# Patient Record
Sex: Male | Born: 1950 | Hispanic: No | Marital: Married | Smoking: Never smoker
Health system: Southern US, Community
[De-identification: ages and names within clinical notes are randomized; demographics above are authoritative.]

## PROBLEM LIST (undated history)

## (undated) DIAGNOSIS — C61 Malignant neoplasm of prostate: Secondary | ICD-10-CM

---

## 2002-05-07 ENCOUNTER — Encounter: Payer: Self-pay | Admitting: General Surgery

## 2002-05-13 ENCOUNTER — Ambulatory Visit (HOSPITAL_COMMUNITY): Admission: RE | Admit: 2002-05-13 | Discharge: 2002-05-13 | Payer: Self-pay | Admitting: General Surgery

## 2002-05-28 ENCOUNTER — Encounter: Payer: Self-pay | Admitting: General Surgery

## 2002-05-28 ENCOUNTER — Ambulatory Visit (HOSPITAL_COMMUNITY): Admission: RE | Admit: 2002-05-28 | Discharge: 2002-05-28 | Payer: Self-pay | Admitting: General Surgery

## 2002-09-18 ENCOUNTER — Encounter: Admission: RE | Admit: 2002-09-18 | Discharge: 2002-09-18 | Payer: Self-pay | Admitting: Thoracic Surgery

## 2002-09-18 ENCOUNTER — Encounter: Payer: Self-pay | Admitting: Thoracic Surgery

## 2003-03-25 ENCOUNTER — Encounter: Admission: RE | Admit: 2003-03-25 | Discharge: 2003-03-25 | Payer: Self-pay | Admitting: Thoracic Surgery

## 2003-07-23 ENCOUNTER — Ambulatory Visit (HOSPITAL_COMMUNITY): Admission: RE | Admit: 2003-07-23 | Discharge: 2003-07-23 | Payer: Self-pay | Admitting: Gastroenterology

## 2004-07-21 ENCOUNTER — Encounter (INDEPENDENT_AMBULATORY_CARE_PROVIDER_SITE_OTHER): Payer: Self-pay | Admitting: Specialist

## 2004-07-21 ENCOUNTER — Ambulatory Visit (HOSPITAL_BASED_OUTPATIENT_CLINIC_OR_DEPARTMENT_OTHER): Admission: RE | Admit: 2004-07-21 | Discharge: 2004-07-21 | Payer: Self-pay | Admitting: General Surgery

## 2004-07-21 ENCOUNTER — Ambulatory Visit (HOSPITAL_COMMUNITY): Admission: RE | Admit: 2004-07-21 | Discharge: 2004-07-21 | Payer: Self-pay | Admitting: General Surgery

## 2007-06-09 ENCOUNTER — Ambulatory Visit: Payer: Self-pay | Admitting: Nephrology

## 2007-06-09 ENCOUNTER — Inpatient Hospital Stay (HOSPITAL_COMMUNITY): Admission: EM | Admit: 2007-06-09 | Discharge: 2007-06-14 | Payer: Self-pay | Admitting: Emergency Medicine

## 2007-06-11 ENCOUNTER — Encounter (INDEPENDENT_AMBULATORY_CARE_PROVIDER_SITE_OTHER): Payer: Self-pay | Admitting: Internal Medicine

## 2007-06-11 ENCOUNTER — Ambulatory Visit: Payer: Self-pay | Admitting: Vascular Surgery

## 2007-12-29 ENCOUNTER — Encounter (INDEPENDENT_AMBULATORY_CARE_PROVIDER_SITE_OTHER): Payer: Self-pay | Admitting: Urology

## 2007-12-29 ENCOUNTER — Inpatient Hospital Stay (HOSPITAL_COMMUNITY): Admission: RE | Admit: 2007-12-29 | Discharge: 2007-12-30 | Payer: Self-pay | Admitting: Urology

## 2010-06-13 NOTE — Consult Note (Signed)
NAMEIBROHIM, SIMMERS            ACCOUNT NO.:  [id number]   MEDICAL RECORD NO.:  [id number]          PATIENT TYPE:  INP   LOCATION:  3311                         FACILITY:  [facility]   PHYSICIAN:  Stephani Police, PA    DATE OF BIRTH:  Jun 26, 1950   DATE OF CONSULTATION:  DATE OF DISCHARGE:                                 CONSULTATION   We were asked to see Mr. Mullet today in consultation by Dr.  Therisa Doyne for a lower GI bleed.   HISTORY OF PRESENT ILLNESS:  This is a 60 year old male who had a rectal  prostate biopsy done on Jun 06, 2007, for an elevated PSA.  On the  afternoon of Jun 06, 2007, he began having bloody diarrhea.  On Saturday,  the diarrhea stopped and he felt better.  It unfortunately restarted  again Sunday night after a heavy dinner.  The patient reports dark  stools but is on iron with bright red spots.  His bleeding stopped  before admission to [facility].  He does take an 81 mg aspirin  per day, but no other NSAIDs.  He denies bleeding since his 2006 surgery  for internal hemorrhoids.  Denies heartburn.  He denies abdominal pain.  States that he has kept his bowel movements soft on stool softeners and  that he has been very regular.  He was on antibiotics Thursday, Friday,  and Saturday for his biopsy.   PAST MEDICAL HISTORY:  1. Bleeding internal hemorrhoids.  He is status post surgery in 2006.  2. History of colon polyps.  His last colonoscopy was in June 2005 by      Dr. Salem Ganem.  It was normal with the exception of some internal      hemorrhoids.  3. He has had bilateral inguinal hernias which have been repaired in      20 04.   ALLERGIES:  He has no known drug allergies.   CURRENT MEDICATIONS:  Multivitamins, stool softener, and 81 mg aspirin.  He was also on antibiotics for 3 days for his prostate biopsy.   REVIEW OF SYSTEMS:  Negative, just per HPI.   SOCIAL HISTORY:  Negative for alcohol.  He is married.   FAMILY HISTORY:  He  has no known GI cancers in his family.   PHYSICAL EXAMINATION:  He is alert and oriented in no apparent distress.  He is pleasant to speak with.  CARDIOVASCULAR SYSTEM:  Regular rate and rhythm.  No murmurs, rubs, or  gallops appreciated.  LUNGS:  Clear to auscultation anteriorly.  ABDOMEN:  Soft, nontender, nondistended with good bowel sounds.   CURRENT LABORATORIES:  A hemoglobin of 8.0 that has dropped from 8.9  last night, hematocrit 23.1, white count 4.4, platelets are 115,000, BUN  18, creatinine 0.89.  PT/PTT has not been drawn, but he is not on any  anticoagulation.  LFTs are normal.   ASSESSMENT:  Dr. Vida Rigger has seen and examined the patient, collected  his history, and reviewed his chart.   IMPRESSION:  His current bleeding is caused by a combination of his  rectal prostate biopsies  along with his antiplatelet medication and  possibly some gastroenteritis.  We will discontinue the patient's  aspirin, keep stools very soft, and slowly advance his diet.  If he  rebleeds, we will perform colonoscopy.  Thanks very much for this  consultation.      Stephani Police, [place]     MLY/MEDQ  D:  06/09/2007  T:  06/10/2007  Job:  [id number]   cc:   Petra Kuba, M.D.  Maretta Bees. Vonita Moss, M.D.

## 2010-06-13 NOTE — Op Note (Signed)
[name], [name]            ACCOUNT NO.:  [id number]   MEDICAL RECORD NO.:  [id number]          PATIENT TYPE:  INP   LOCATION:  0012                         FACILITY:  [facility]   PHYSICIAN:  Heloise Purpura, MD      DATE OF BIRTH:  31-Oct-1950   DATE OF PROCEDURE:  12/29/2007  DATE OF DISCHARGE:                               OPERATIVE REPORT   PREOPERATIVE DIAGNOSIS:  Clinically localized adenocarcinoma of the  prostate (clinical stage T1c Nx Mx).   POSTOPERATIVE DIAGNOSIS:  Clinically localized adenocarcinoma of the  prostate (clinical stage T1c Nx Mx).   PROCEDURE:  Robotic assisted laparoscopic radical prostatectomy  (bilateral nerve sparing).   SURGEON:  Heloise Purpura, MD   ASSISTANT:  Delia Chimes, NP   ANESTHESIA:  General.   COMPLICATIONS:  None.   ESTIMATED BLOOD LOSS:  200 mL.   INTRAVENOUS FLUIDS:  2500 mL of lactated Ringer's.   SPECIMENS:  Prostate and seminal vesicles.   DISPOSITION OF SPECIMENS:  To pathology.   DRAINS:  1. 20-French coude catheter.  2. #19 Blake pelvic drain.   INDICATIONS:  Mr. Richert is a 60 year old gentleman with clinically  localized adenocarcinoma of prostate.  After a discussion regarding  management options for treatment, he elected to proceed with surgical  therapy and the above procedure.  Potential risks, complications, and  alternative options were discussed in detail and informed consent was  obtained.   DESCRIPTION OF PROCEDURE:  The patient was taken to the operating room  and a general anesthetic was administered.  He was given preoperative  antibiotics, placed in the dorsal lithotomy position, prepped and draped  in the usual sterile fashion.  Next, a preoperative time-out was  performed.  A Foley catheter was then inserted into the bladder and a  site was selected just inferior to the umbilicus for placement of the  camera port.  This was placed using a standard open Hassan technique  which allowed entry into  the peritoneal cavity under direct vision  without difficulty.  A 12 mm port was then placed and a pneumoperitoneum  established.  The 0 degrees lens was used to inspect the abdomen and  there was noted to be mild adhesions between the colon and lateral right  abdominal wall.  In addition, there was marked scarring along the left  inguinal canal with adhesion of the colon to the lateral aspect of the  bladder on the left. There was no evidence for any intra-abdominal  injuries or other abnormalities. The 5 mm port was placed in the right  upper quadrant and the adhesions were taken down with sharp scissor  dissection.  The remaining ports were then placed.  Bilateral 8 mm  robotic ports were placed 10 cm lateral to and just inferior to the  camera port site.  An additional 8 mm robotic port was placed in the far  left lateral abdominal wall.  An additional 12 mm port was placed in the  far right lateral abdominal wall for laparoscopic assistance.  All ports  were placed under direct vision and without difficulty.  The surgical  cart was  then docked.  With the aid of the cautery scissors, the bladder  was reflected posteriorly allowing entry into space of Retzius and  identification of the endopelvic fascia and prostate. This dissection  was difficult due to the extensive scarring between the bladder and the  patient's mesh hernia repair. A persistent defect was noted in the right  inguinal region consistent with an inguinal hernia defect.  The  endopelvic fascia was then incised from the apex back to the base of the  prostate bilaterally and the underlying levator muscle fibers were swept  laterally off the prostate thereby isolating the dorsal vein.  The  dorsal venous complex was then stapled and divided with a 45 mm Flex ETS  stapler.  The bladder neck was then identified with the aid of Foley  catheter manipulation and was divided anteriorly thereby exposing the  Foley catheter.  The  catheter balloon was deflated and the catheter was  brought into the operative field and used to retract the prostate  anteriorly.  This exposed the posterior bladder neck which was then  divided and dissection proceeded between the bladder neck and prostate  until the vasa deferentia and seminal vesicles were identified.  The  vasa deferentia were isolated, divided and lifted anteriorly.  The  seminal vesicles were then dissected down to their tips with care to  control seminal vesicle arterial blood supply.  The space between  Denonvilliers fascia and the anterior rectum was then bluntly developed  thereby isolating the vascular pedicles of the prostate.  The lateral  prostatic fascia was then sharply incised bilaterally allowing the  neurovascular bundles to be released.  The vascular pedicles of prostate  were then ligated with Hem-o-lok clips and divided with sharp cold  scissor dissection above the level of the neurovascular bundles and the  neurovascular bundles were then swept off the apex of the prostate and  urethra.  The urethra was then sharply divided allowing the specimen to  be disarticulated.  The pelvis was copiously irrigated and hemostasis  ensured.  There was no evidence for a rectal injury.  A 2-0 Vicryl slip-  knot was then placed at the 6 o'clock position between Denonvilliers  fascia, the posterior bladder neck and the posterior urethra to  reapproximate these structures.  A double-armed 3-0 Monocryl suture was  then used to perform a 360 degree running tension-free anastomosis  between the bladder neck and urethra.  A new 20-French coude catheter  was inserted into the bladder and irrigated.  There no blood clots  within the bladder and the anastomosis appeared to be watertight.  The  surgical cart was then undocked.  A #19 Blake drain was brought through  the left robotic port and appropriately positioned within the pelvis.  It was secured to skin with a nylon  suture.  The right lateral 12 mm  port site was closed with a 0 Vicryl fascial suture placed with the aid  of the suture passer device.  All remaining ports were then removed  under direct vision and the prostate specimen was removed intact within  the Endopouch retrieval bag via the periumbilical port site.  This  fascial opening was then closed with a running 9 Vicryl suture.  All  port sites were injected with 0.25% Marcaine and reapproximated at the  skin level with staples.  Sterile dressings were applied.  The patient  appeared to tolerate the procedure well without complications.  He was  able to be extubated and  transferred to the recovery unit in  satisfactory condition.      Heloise Purpura, MD  Electronically Signed     LB/MEDQ  D:  12/29/2007  T:  12/30/2007  Job:  [id number]

## 2010-06-13 NOTE — H&P (Signed)
[name], [name]            ACCOUNT NO.:  [id number]   MEDICAL RECORD NO.:  [id number]          PATIENT TYPE:  EMS   LOCATION:  MAJO                         FACILITY:  [facility]   PHYSICIAN:  Maude Leriche, MD      DATE OF BIRTH:  [birth year]   DATE OF ADMISSION:  06/08/2007  DATE OF DISCHARGE:                              HISTORY & PHYSICAL   PRIMARY CARE PHYSICIAN:  Miguel Aschoff, M.D. with [facility].   CHIEF COMPLAINT:  Bright red blood per rectum.   HISTORY OF PRESENT ILLNESS:  [name] is a 60 year old gentleman  with remote history of hemorrhoidectomy who presents for evaluation of  bright red blood per rectum.  He underwent a transrectal prostate biopsy  on Jun 06, 2007 after being n.p.o. and having a enema prep.  The patient  reports that after the biopsy he had a little bit of bleeding as  expected, but then he developed loose stools complicated by bright red  blood per rectum with stooling throughout the day on the May 8.  This  improved on May 9.  However, returned the morning of May 10.  The  patient had several large meals throughout the day during May 10, and  had frequent bowel movements that were loose and had bright red blood in  the stool.  During the day, he began feeling tired and weak and actually  had a syncopal event that was witnessed by his wife during which he was  unconscious for 30 seconds.  At this time, EMS was called and the  patient was transported to the [facility] emergency department.  Mr.  Thomas denies fever, chills, nausea, vomiting, dysuria, hematuria,  shortness of breath or chest pain at this time.   REVIEW OF SYSTEMS:  As per present illness.  All 14-point review of  systems were obtained and negative.   PAST MEDICAL HISTORY:  Status post hemorrhoid surgery 3 years ago.  The  patient reports mild anemia secondary to take his hemorrhoids that was  corrected by surgery.   FAMILY HISTORY:  Significant for hypertension,  coronary artery disease  and peptic ulcer disease.   SOCIAL HISTORY:  The patient denies tobacco, alcohol or drug use.  [place]  Is married and works as an Oceanographer.   DRUG ALLERGIES:  No known drug allergies.   MEDICATIONS:  1. Multivitamin.  2. Aspirin 81 mg daily.  3. Stool softener as needed.   PHYSICAL EXAMINATION:  VITAL SIGNS:  Temperature is 97 degrees  Fahrenheit, blood pressure 88/63 on arrival, improved 100/66 after IV  fluids. Respirations 20, pulse 69-72.  GENERAL:  The patient with pale skin.  Alert and oriented x4.  HEENT:  Oropharynx clear.  Conjunctivae pale.  Extraocular movements  intact.  Pupils equally round and reactive light and accommodation.  Normocephalic, atraumatic.  NECK:  Supple without previous distention or lymphadenopathy.  CHEST:  Clear to auscultation bilaterally without wheezes, rales or  rhonchi.  HEART:  Regular rate and rhythm without murmur, rub or gallop.  ABDOMEN:  Soft, nontender to palpation.  Positive bowel sounds.  No  masses or organomegaly appreciated  RECTAL:  Patient with crusted blood around the anus.  No visible  external hemorrhoids.  Digital rectal exam reveals no masses or  hemorrhage internally.  No stool detected.  The patient does have orange-  colored discoloration of gloved finger.  Heme-positive on evaluation.  EXTREMITIES:  No lower extremity edema.  No clubbing or cyanosis noted.  SKIN:  Grossly intact.  No wounds or ulcerations appreciated.  NEUROLOGIC:  Grossly intact   LABORATORY DATA:  Hemoglobin is 8.9.  White blood count of 7.9.  Platelets 121,000.   IMPRESSION:  Lower gastrointestinal bleed complicated by acute blood  loss anemia and hypotension.   PLAN OF ACTION:  1. Admit the patient to the [facility].  2. I have consulted [facility] and they will evaluate the patient in the      morning, but I will recontact them if he becomes unstable tonight.      We  will check q.4 h complete blood counts.  He has already been      typed and crossed.  We will transfuse if hemoglobin is less than 8      or  the patient becomes symptomatic.  We will continue IV fluid      expansion and keep him n.p.o. after midnight for hopeful      gastroenterology evaluation in the morning.  3. Will place him in the step-down unit for closer monitoring due to      his only recent stable blood pressure.      Maude Leriche, MD  Electronically Signed     EWR/MEDQ  D:  06/09/2007  T:  06/09/2007  Job:  [id number]

## 2010-06-13 NOTE — Consult Note (Signed)
[name], [name]            ACCOUNT NO.:  [id number]   MEDICAL RECORD NO.:  [id number]          PATIENT TYPE:  INP   LOCATION:  3311                         FACILITY:  [facility]   PHYSICIAN:  Sigmund I. Patsi Sears, M.D.DATE OF BIRTH:  January 05, 1951   DATE OF CONSULTATION:  DATE OF DISCHARGE:                                 CONSULTATION   SUBJECTIVE:  This is a 60 year old married white male, status post  transrectal needle biopsy of the prostate on Friday morning.  The  patient had routine mechanical preparation with enema on Friday morning  prior to the biopsy.  In addition, the patient had both oral and IM  antibiotic per routine.  Biopsy was somewhat complicated per the  patient's history, secondary to scarring from prior hemorrhoidectomy.  The patient began to have bloody diarrhea on Friday afternoon.  He  describes his bowel movements as soft, but somewhat formed.  The  diarrhea and blood resolved on Saturday morning.  However, on Sunday,  both diarrhea and bleeding returned, and the patient began to feel  dizzy.  He was referred to emergency room, where he was noted to have a  hemoglobin of 8.5 and dehydration.  He is admitted for further  reevaluation of GI bleeding.   PAST MEDICAL HISTORY:  1. Significant for hemorrhoidectomy (Dr. Johna Thomas).  2. He is also significant for hernia repair.   PAST TOBACCO:  None.   ALCOHOL:  None.   SOCIAL HISTORY:  The patient is married.  He has celebrated Mother's Day  weekend with his family, eating foods which he is unaccustomed to  (sausage, etc.), and does not know if his diarrhea could be related to  that.   MEDICATIONS:  None.   FAMILY HISTORY:  Noncontributory.   REVIEW OF SYSTEMS:  Constitutional:  Otherwise, noncontributory.  GI:  The patient has no upper GI symptoms.   PHYSICAL EXAMINATION:  GENERAL:  A well-developed, well-nourished male,  in no acute distress.  VITAL SIGNS:  His blood pressure in emergency room was  88/63, pulse 69,  and respiratory rate 20.  NECK:  Supple and nontender.  CHEST:  Clear to P&A.  ABDOMEN:  Soft and nondistended plus bowel sounds.  There is no  organomegaly and no masses.  There is no CVA pain.  GENITOURINARY:  Normal penis, circumcised.  Testicles are descended  bilaterally, measured 4 x 4 cm and nontender.  The epididymis is normal.  The glans is normal and the penile foreskin is normal.  RECTAL:  Normal sphincter tone.  Prostate is nontender, measures 3+.  There is no gross blood.  EXTREMITIES:  No cyanosis.  No edema.  PSYCHOLOGIC:  Normal orientation to time, person, and place.   ASSESSMENT:  Gastrointestinal bleed post transrectal needle biopsy of  the prostate.  There is no evidence of sepsis.  The patient's white  count is 7900 in the emergency room, and hemoglobin dropped to 8.9.  He  is admitted for further observation.      Sigmund I. Patsi Sears, M.D.  Electronically Signed     SIT/MEDQ  D:  06/09/2007  T:  06/09/2007  Job:  [id number]   cc:   [name], M.D.

## 2010-06-16 NOTE — Discharge Summary (Signed)
[name], [name]            ACCOUNT NO.:  [id number]   MEDICAL RECORD NO.:  [id number]          PATIENT TYPE:  INP   LOCATION:  4713                         FACILITY:  [facility]   PHYSICIAN:  Michiel Cowboy, MDDATE OF BIRTH:  Apr 24, 1950   DATE OF ADMISSION:  06/08/2007  DATE OF DISCHARGE:  06/14/2007                               DISCHARGE SUMMARY   CONSULTANTS:  1. Urology, Sigmund I. Patsi Sears, MD.  2. GI, Petra Kuba, MD.   DISCHARGE DIAGNOSES:  1. Gastrointestinal bleed, mild, self-resolving.  2. Oxacillin-sensitive Staphylococcus aureus bacteremia, secondary to      infected IV site with clearance of bacteremia.  3. Mild thrombocytopenia.  4. History of hemorrhoid surgery.  5. History of prostate biopsy.  6. Anemia.   STUDIES:  1. Chest x-ray done on 2007-06-16, showing no acute chest disease or      granulomatous disease noted.  2. Blood culture done on Jun 11, 2007, is positive for MSSA.  Repeat      blood cultures on Jun 13, 2007, negative.  The patient was      discharged to home on a course of dicloxacillin for a total      duration of 2 weeks, to complete 10 days at home.   HOSPITAL COURSE:  The patient was initially admitted for what was  thought to be GI bleed.  He was initially put on the stepdown, did well,  had no more bright red blood per rectum.  Initially, this was thought to  be secondary to recent prostate biopsy.  The patient was hemodynamically  stable.  His blood pressure was in the low 90s-110 which, per the  patient is chronic for him.  He was transferred to the floor, but the  next day developed a fever.  His blood pressure started to drift down  below 90s.  The patient started to appear ill.  Blood cultures, chest x-  ray, and urine cultures were obtained.  The patient was initially  started on Zosyn for coverage of presumed gram-negative secondary to  prostate biopsy, but his blood cultures thereafter, grew OSSA at which  point, the  patient was switched to vancomycin to complete 2 more days of  this.  His repeat blood cultures were shown to be clearance.  His IV  site was disconnected.  The redness around the old IV site was slightly  secondary to cellulitis, secondary to infected peripheral IV.  The  patient remained afebrile, was discharged to home on dicloxacillin to  complete the course.   DISCHARGE MEDICATIONS:  1. Aspirin 325 mg to be stopped, do not take.  2. Coreg 100 mg p.o. b.i.d.  3. Protonix 40 mg p.o. daily x2 weeks.  4. Dicloxacillin for 10 days p.o. 500 mg every 6 hours.   FOLLOWUP:  The patient is to follow up with Dr. Tenny Craw in 2 weeks and Dr.  Eloise Harman.      Michiel Cowboy, MD  Electronically Signed     AVD/MEDQ  D:  07/04/2007  T:  07/04/2007  Job:  [id number]   cc:   Miguel Aschoff,  M.D.  Barry Dienes. Eloise Harman, M.D.  Madaline Savage, M.D.

## 2010-06-16 NOTE — Op Note (Signed)
NAME:  [name], [name]                      ACCOUNT NO.:  [id number]   MEDICAL RECORD NO.:  [id number]                   PATIENT TYPE:  AMB   LOCATION:  DAY                                  FACILITY:  [facility]   PHYSICIAN:  Sharlet Salina T. Hoxworth, M.D.          DATE OF BIRTH:  April 25, 1950   DATE OF PROCEDURE:  05/13/2002  DATE OF DISCHARGE:                                 OPERATIVE REPORT   PREOPERATIVE DIAGNOSIS:  Bilateral inguina herniae.   POSTOPERATIVE DIAGNOSIS:  Bilateral inguinal herniae.   SURGICAL PROCEDURE:  Laparoscopic repair of bilateral inguinal herniae.   ANESTHESIA:  General.   BRIEF HISTORY:  The patient is a 60 year old white male who presents with  long-standing gradually enlarging bilateral inguinal hernias confirmed on  exam.  Options for repair were discussed, and he would like to proceed with  laparoscopic repair.  The nature of the procedure, indications, and risks of  bleeding, infection, recurrence, possible need for open procedure were  discussed and understood.  He is now brought to the operating room for this  procedure.   DESCRIPTION OF OPERATION:  The patient was brought to the operating room,  placed in the supine position on the operating table, and general  endotracheal anesthesia was induced.  Foley catheter was placed, and the  abdomen was sterilely prepped and draped.  He received preoperative  antibiotics.  Local anesthesia was used to infiltrate the trocar sites.  A 1-  cm incision was made at the umbilicus and dissection carried down to the  anterior fascia which was incised for 1 cm, and the right rectus muscle  retracted laterally, and the preperitoneal space entered.  The balloon  dissector was passed into this space and its tip positioned at the pubic  symphysis and it was inflated with good bilateral deployment of the balloon.  This was left in place with three minutes for hemostasis and removed, and  the structural balloon trocar  placed, and the CO2 pressure applied.  Under  direct vision, two 5-mm trocars were placed in the midline.  I began  dissection on the right side.  The pubic symphysis was identified, and the  Cooper's ligament cleared down to the iliac vessels which were protected.  The epigastric vessels were identified.  The peritoneum was further  dissected off from the anterior abdominal wall laterally out to the anterior-  superior iliac spine.  The anterior edge of the peritoneum was identified,  and this was traced back medially.  It was seen to course up onto the cord  structures which were identified.  There was no apparent hernia in the  direct space.  The peritoneal sac was then dissected away from the cord  structures posteriorly.  This was somewhat tedious dissection with the sac  fairly stuck to the cord structures.  A small opening was made in the sac  which was closed with clips.  Pneumoperitoneum was evacuated with a Veress  needle in the left upper quadrant.  The peritoneum was then dissected  completely posteriorly off the cord structures.  A similar dissection was  then performed on the left side.  However, on this side, there was actually  a large direct defect medial to the epigastric vessels that contained not  only preperitoneal fat but peritoneum as well which was all swept  inferiorly, cord structures defined and skeletonized.  Following complete  bilateral dissection, I used a extra-large piece of Bard preformed  polypropylene mesh which was inserted into the preperitoneum on the left and  oriented, and then tacked to the pubic symphysis and along Cooper's ligament  laterally, tacked to the anterior wall, being able to feel each tack through  the anterior abdominal wall, working back medially, then along the medial  edge of the mesh.  This provided very broad coverage of the direct and  indirect spaces.  On the smaller hernia on the right side, a medium piece of  Bard preformed  mesh was placed, oriented, and tacked in identical fashion.  The operative site was inspected for hemostasis, and trocars removed, and  all CO2  evacuated.  The mattress suture was secured at the umbilicus.  Skin  incisions were closed with interrupted subcuticular 4-0 Monocryl and Steri-  Strips.  Sponge, needle, and instrument counts were correct.  Sterile  dressings were applied, and the patient was taken to the recovery room in  good condition.                                               Lorne Skeens. Hoxworth, M.D.    Tory Emerald  D:  05/13/2002  T:  05/13/2002  Job:  [id number]

## 2010-06-16 NOTE — Op Note (Signed)
NAME:  [name], [name]                      ACCOUNT NO.:  [id number]   MEDICAL RECORD NO.:  [id number]                   PATIENT TYPE:  AMB   LOCATION:  ENDO                                 FACILITY:  [facility]   PHYSICIAN:  Graylin Shiver, M.D.                DATE OF BIRTH:  08-19-1950   DATE OF PROCEDURE:  07/23/2003  DATE OF DISCHARGE:                                 OPERATIVE REPORT   PROCEDURE:  Colonoscopy.   INDICATIONS FOR PROCEDURE:  History of colon polyps.   Informed consent was obtained after explanation of the risks of bleeding,  infection and perforation.   PREMEDICATION:  Fentanyl 100 mcg IV, Versed 8 mg IV.   DESCRIPTION OF PROCEDURE:  With the patient in the left lateral decubitus  position, a rectal exam was performed, no masses were felt. The Olympus  colonoscope was then inserted into the rectum and advanced around the colon  to the cecum. Cecal landmarks were identified. The cecum and ascending colon  were normal. The transverse colon was normal.  The descending colon, sigmoid  and rectum were normal. A moderate size internal hemorrhoids was present.  He tolerated the procedure well without complications.   IMPRESSION:  Normal colonoscopy to the cecum, no evidence of polyps. There  is an internal hemorrhoid.   I would recommend a followup colonoscopy in five years.                                               Graylin Shiver, M.D.    [name]  D:  07/23/2003  T:  07/23/2003  Job:  [id number]   cc:   C. Duane Lope, M.D.  [address]  [place]  [place] [address]  Fax: [phone]
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
## 2010-10-25 LAB — CBC
HCT: 25.5 — ABNORMAL LOW
HCT: 26.7 — ABNORMAL LOW
Hemoglobin: 9.1 — ABNORMAL LOW
Hemoglobin: 9.1 — ABNORMAL LOW
Hemoglobin: 9.3 — ABNORMAL LOW
MCHC: 34.2
MCHC: 34.6
MCV: 94.4
MCV: 95
Platelets: 99 — ABNORMAL LOW
RBC: 2.81 — ABNORMAL LOW
RBC: 2.82 — ABNORMAL LOW
RDW: 13.7
WBC: 3.6 — ABNORMAL LOW
WBC: 4

## 2010-10-25 LAB — BASIC METABOLIC PANEL
CO2: 26
Chloride: 109
GFR calc Af Amer: >60
Potassium: 3.8
Sodium: 142

## 2010-10-25 LAB — COMPREHENSIVE METABOLIC PANEL
ALT: 66 — ABNORMAL HIGH
AST: 52 — ABNORMAL HIGH
Alkaline Phosphatase: 48
CO2: 22
GFR calc Af Amer: >60
Glucose, Bld: 101 — ABNORMAL HIGH
Potassium: 3.6
Sodium: 140
Total Protein: 5 — ABNORMAL LOW

## 2010-10-25 LAB — OCCULT BLOOD X 1 CARD TO LAB, STOOL
Fecal Occult Bld: NEGATIVE
Fecal Occult Bld: NEGATIVE

## 2010-10-25 LAB — CULTURE, BLOOD (ROUTINE X 2): Culture: NO GROWTH

## 2010-10-31 LAB — BASIC METABOLIC PANEL
CO2: 31 mEq/L (ref 19–32)
Calcium: 9.4 mg/dL (ref 8.4–10.5)
Chloride: 104 mEq/L (ref 96–112)
Creatinine, Ser: 0.86 mg/dL (ref 0.4–1.5)
GFR calc Af Amer: >60 mL/min (ref >60)
Glucose, Bld: 125 mg/dL — ABNORMAL HIGH (ref 70–99)

## 2010-10-31 LAB — TYPE AND SCREEN
ABO/RH(D): AB POS
Antibody Screen: NEGATIVE

## 2010-10-31 LAB — CBC
MCHC: 33.8 g/dL (ref 30.0–36.0)
MCV: 97.6 fL (ref 78.0–100.0)
RBC: 4.09 MIL/uL — ABNORMAL LOW (ref 4.22–5.81)
RDW: 12.8 % (ref 11.5–15.5)

## 2010-10-31 LAB — HEMOGLOBIN AND HEMATOCRIT, BLOOD
HCT: 35.3 % — ABNORMAL LOW (ref 39.0–52.0)
Hemoglobin: 12 g/dL — ABNORMAL LOW (ref 13.0–17.0)

## 2010-11-02 LAB — HEMOGLOBIN AND HEMATOCRIT, BLOOD: HCT: 34 % — ABNORMAL LOW (ref 39.0–52.0)

## 2014-06-26 ENCOUNTER — Emergency Department (HOSPITAL_COMMUNITY)
Admission: EM | Admit: 2014-06-26 | Discharge: 2014-06-26 | Disposition: A | Discharge status: Home / Self Care | Payer: BLUE CROSS/BLUE SHIELD | Attending: Emergency Medicine | Admitting: Emergency Medicine

## 2014-06-26 ENCOUNTER — Encounter (HOSPITAL_COMMUNITY): Payer: Self-pay | Admitting: Emergency Medicine

## 2014-06-26 ENCOUNTER — Emergency Department (HOSPITAL_COMMUNITY): Payer: BLUE CROSS/BLUE SHIELD

## 2014-06-26 DIAGNOSIS — Z8546 Personal history of malignant neoplasm of prostate: Secondary | ICD-10-CM | POA: Diagnosis not present

## 2014-06-26 DIAGNOSIS — S92902A Unspecified fracture of left foot, initial encounter for closed fracture: Secondary | ICD-10-CM

## 2014-06-26 DIAGNOSIS — Y998 Other external cause status: Secondary | ICD-10-CM | POA: Diagnosis not present

## 2014-06-26 DIAGNOSIS — S92322A Displaced fracture of second metatarsal bone, left foot, initial encounter for closed fracture: Secondary | ICD-10-CM | POA: Diagnosis not present

## 2014-06-26 DIAGNOSIS — W108XXA Fall (on) (from) other stairs and steps, initial encounter: Secondary | ICD-10-CM | POA: Insufficient documentation

## 2014-06-26 DIAGNOSIS — Y9389 Activity, other specified: Secondary | ICD-10-CM | POA: Insufficient documentation

## 2014-06-26 DIAGNOSIS — Y9289 Other specified places as the place of occurrence of the external cause: Secondary | ICD-10-CM | POA: Diagnosis not present

## 2014-06-26 DIAGNOSIS — S99922A Unspecified injury of left foot, initial encounter: Secondary | ICD-10-CM | POA: Diagnosis present

## 2014-06-26 HISTORY — DX: Malignant neoplasm of prostate: C61

## 2014-06-26 MED ORDER — OXYCODONE-ACETAMINOPHEN 5-325 MG PO TABS: 1.0000 | ORAL_TABLET | Freq: Four times a day (QID) | ORAL | Status: AC | PRN

## 2014-06-26 NOTE — Discharge Instructions (Signed)
Please follow up with Dr. Percell Miller to schedule a follow up appointment. Please take pain medication and/or muscle relaxants as prescribed and as needed for pain. Please do not drive on narcotic pain medication or on muscle relaxants. Please use crutches and be non-weight bearing until seen by Dr. Percell Miller. Please read all discharge instructions and return precautions.   Metatarsal Fracture, Undisplaced A metatarsal fracture is a break in the bone(s) of the foot. These are the bones of the foot that connect your toes to the bones of the ankle. DIAGNOSIS  The diagnoses of these fractures are usually made with X-rays. If there are problems in the forefoot and x-rays are normal a later bone scan will usually make the diagnosis.  TREATMENT AND HOME CARE INSTRUCTIONS  Treatment may or may not include a cast or walking shoe. When casts are needed the use is usually for short periods of time so as not to slow down healing with muscle wasting (atrophy).  Activities should be stopped until further advised by your caregiver.  Wear shoes with adequate shock absorbing capabilities and stiff soles.  Alternative exercise may be undertaken while waiting for healing. These may include bicycling and swimming, or as your caregiver suggests.  It is important to keep all follow-up visits or specialty referrals. The failure to keep these appointments could result in improper bone healing and chronic pain or disability.  Warning: Do not drive a car or operate a motor vehicle until your caregiver specifically tells you it is safe to do so. IF YOU DO NOT HAVE A CAST OR SPLINT:  You may walk on your injured foot as tolerated or advised.  Do not put any weight on your injured foot for as long as directed by your caregiver. Slowly increase the amount of time you walk on the foot as the pain allows or as advised.  Use crutches until you can bear weight without pain. A gradual increase in weight bearing may help.  Apply  ice to the injury for 15-20 minutes each hour while awake for the first 2 days. Put the ice in a plastic bag and place a towel between the bag of ice and your skin.  Only take over-the-counter or prescription medicines for pain, discomfort, or fever as directed by your caregiver. SEEK IMMEDIATE MEDICAL CARE IF:   Your cast gets damaged or breaks.  You have continued severe pain or more swelling than you did before the cast was put on, or the pain is not controlled with medications.  Your skin or nails below the injury turn blue or grey, or feel cold or numb.  There is a bad smell, or new stains or pus-like (purulent) drainage coming from the cast. MAKE SURE YOU:   Understand these instructions.  Will watch your condition.  Will get help right away if you are not doing well or get worse. Document Released: 10/07/2001 Document Revised: 04/09/2011 Document Reviewed: 08/29/2007 [facility] Patient Information 2015 [place], [place]. This information is not intended to replace advice given to you by your health care provider. Make sure you discuss any questions you have with your health care provider.

## 2014-06-26 NOTE — ED Provider Notes (Signed)
CSN: [id number]     Arrival date & time 06/26/14  1803 History  This chart was scribed for Jim Roes, PA-C, working with Dorie Rank, MD by Girtha Hake, ED Scribe. The patient was seen in W[facility]. The patient's care was started at 6:20 PM.     Chief Complaint  Patient presents with  . Foot Injury    Patient is a 64 y.o. male presenting with foot injury. The history is provided by the patient. No language interpreter was used.  Foot Injury Location:  Foot Time since incident:  1 hour Injury: yes   Mechanism of injury: fall   Fall:    Fall occurred:  Down stairs   Point of impact:  Feet Foot location:  L foot Pain details:    Quality:  Aching   Radiates to:  Does not radiate   Severity:  Moderate   Onset quality:  Sudden   Duration:  1 hour   Timing:  Constant   Progression:  Unchanged Chronicity:  New Dislocation: no   Foreign body present:  No foreign bodies Prior injury to area:  No Ineffective treatments:  Ice Associated symptoms: swelling   Associated symptoms: no neck pain   HPI Comments: [name] is a 64 y.o. male who presents to the Emergency Department complaining of a fall that occurred approximately 1 hour ago. Patient reports that he fell down five stairs and injured his left foot. He reports moderate pain and swelling in the left foot. Patient states that the pain does not radiate to his ankle or leg. He has applied ice with no relief of the swelling. He has not taken any medications for the pain. Patient denies head trauma, LOC, vomiting, or neck pain. Patient reports that he does not take blood thinners at home.    Past Medical History  Diagnosis Date  . Prostate cancer    History reviewed. No pertinent past surgical history. No family history on file. History  Substance Use Topics  . Smoking status: Never Smoker   . Smokeless tobacco: Not on file  . Alcohol Use: Yes    Review of Systems  Gastrointestinal: Negative for vomiting.   Musculoskeletal: Positive for arthralgias. Negative for neck pain.  Neurological: Negative for syncope.  All other systems reviewed and are negative.     Allergies  Review of patient's allergies indicates not on file.  Home Medications   Prior to Admission medications   Medication Sig Start Date End Date Taking? Authorizing Provider  oxyCODONE-acetaminophen (PERCOCET) 5-325 MG per tablet Take 1-2 tablets by mouth every 6 (six) hours as needed. 06/26/14   Baron Sane, PA-C   Triage Vitals: BP 123/76 mmHg  Pulse 70  Temp(Src) 98.7 F (37.1 C) (Oral)  Resp 20  SpO2 99% Physical Exam  Constitutional: He is oriented to person, place, and time. He appears well-developed and well-nourished. No distress.  HENT:  Head: Normocephalic and atraumatic.  Right Ear: External ear normal.  Left Ear: External ear normal.  Nose: Nose normal.  Mouth/Throat: Oropharynx is clear and moist.  Eyes: Conjunctivae are normal.  Neck: Normal range of motion. Neck supple.  No nuchal rigidity.   Cardiovascular: Normal rate, regular rhythm, normal heart sounds and intact distal pulses.   Pulmonary/Chest: Effort normal and breath sounds normal.  Abdominal: Soft.  Musculoskeletal: Normal range of motion.       Left ankle: Normal.       Left foot: There is tenderness and swelling. There is normal  range of motion, normal capillary refill, no crepitus, no deformity and no laceration.  Neurological: He is alert and oriented to person, place, and time.  Skin: Skin is warm and dry. He is not diaphoretic.  Psychiatric: He has a normal mood and affect.  Nursing note and vitals reviewed.   ED Course  Procedures (including critical care time) DIAGNOSTIC STUDIES: Oxygen Saturation is 99% on room air, normal by my interpretation.    COORDINATION OF CARE:    Labs Review Labs Reviewed - No data to display  Imaging Review Dg Foot Complete Left  06/26/2014   CLINICAL DATA:  Status post fall; left  foot pain and swelling. Initial encounter.  EXAM: LEFT FOOT - COMPLETE 3+ VIEW  COMPARISON:  None.  FINDINGS: There is a mildly displaced fracture through the midshaft of the second metatarsal, with mild lateral displacement. There also appears to be a small cortical fracture involving the medial aspect of the base of the third metatarsal, without definite evidence of intra-articular extension. On the oblique view, there is mild widening between the second and third metatarsals, raising question for some degree of Lisfranc injury. Would correlate clinically.  Overlying dorsal soft tissue swelling is noted at the midfoot. A tiny osseous fragment at the dorsum of the midfoot may reflect a small avulsion fracture. The subtalar joint is unremarkable. No additional fractures are seen.  IMPRESSION: 1. Mildly displaced fracture through the midshaft of the second metatarsal, with mild lateral displacement. 2. Small cortical fracture involving the medial aspect of the base of the third metatarsal, without definite evidence of intra-articular extension. On the oblique view, there is mild widening between the second and third metatarsals, raising question for some degree of underlying Lisfranc injury. Would correlate clinically. 3. Tiny osseous fragment at the dorsum of the midfoot may reflect a small avulsion fracture.   Electronically Signed   By: Garald Balding M.D.   On: 06/26/2014 18:25     EKG Interpretation None      Discussed case with Dr. Percell Miller who requests CT scan and then splint and non-weight bearing. With outpatient follow up Wednesday at 2:95MW  SPLINT APPLICATION Date/Time: 4:13 PM Authorized by: Harlow Mares Consent: Verbal consent obtained. Risks and benefits: risks, benefits and alternatives were discussed Consent given by: patient Splint applied by: orthopedic technician Location details: left foot Splint type: posterior leg Supplies used: ortho glass Post-procedure: The  splinted body part was neurovascularly unchanged following the procedure. Patient tolerance: Patient tolerated the procedure well with no immediate complications.    MDM   Final diagnoses:  Foot fracture, left   Filed Vitals:   06/26/14 1806  BP: 123/76  Pulse: 70  Temp: 98.7 F (37.1 C)  Resp: 20   Afebrile, NAD, non-toxic appearing, AAOx4.   Patient X-Ray reviewed with fractures noted to second and third metatarsals. Question for Lisfranc fracture. Neurovascularly intact. Normal sensation. No evidence of compartment syndrome. Pain managed in ED. Discussed with Dr. Percell Miller who recommends CT scan of foot and follow up in his office on Wednesday. Patient given Ibuprofen (declined strong pain medication), foot splinted, crutches given while in ED, conservative therapy recommended and discussed. Patient will be dc home &  is agreeable with above plan.Patient is stable at time of discharge      I personally performed the services described in this documentation, which was scribed in my presence. The recorded information has been reviewed and is accurate.     Baron Sane, PA-C 06/26/14 1958  Dorie Rank, MD 06/30/14 1440

## 2014-06-26 NOTE — ED Notes (Signed)
Pt c/o L foot pain after tripping and falling down 5 steps. Pt denies injury elsewhere, no head injury. Pt did not black out or become dizzy and fall. Pt has swelling to top of L foot. Pt can feel and move his toes. Denies numbness and tingling. Pt can put weight on his heel but sts he hasn't tried to ambulate on "whole foot." Pt AU&Ox4.

## 2017-01-21 IMAGING — CT CT FOOT*L* W/O CM
3 of 9 series · 9 of 33 positions shown, 10 images · non-contrast
Comparison: None.

CLINICAL DATA: Acute onset of left foot pain after tripping and
falling down 5 steps. Dorsal left foot swelling. Initial encounter.

EXAM:
CT OF THE LEFT FOOT WITHOUT CONTRAST
TECHNIQUE: Multidetector CT imaging of the left foot was performed according to
the standard protocol. Multiplanar CT image reconstructions were
also generated.

[Series 604: <mpr thick range(2)> · axial · 0.59mm/px · z∈[+33,+91]mm · 2 of 88 slices shown, 3 images]
[im 30/88  soft-tissue]
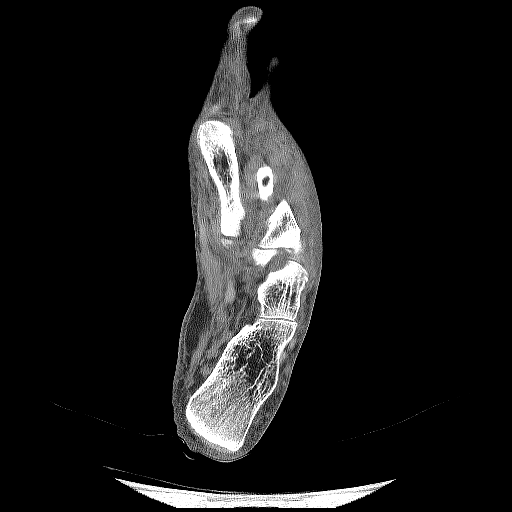
[im 30/88  bone]
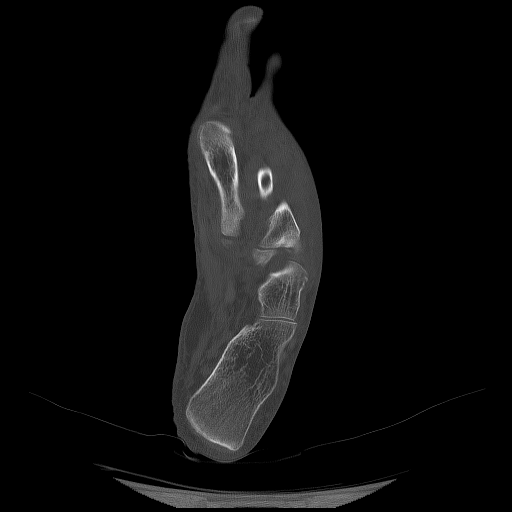
[im 59/88  bone]
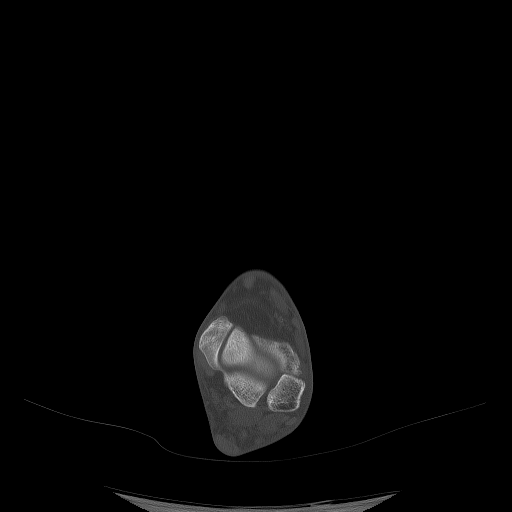

[Series 606: <mpr thick range(4)> · axial · 0.59mm/px · z∈[+16,+71]mm · 2 of 86 slices shown]
[im 29/86  bone]
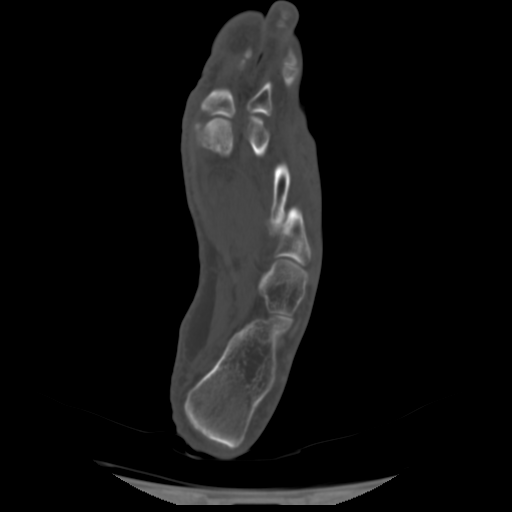
[im 57/86  bone]
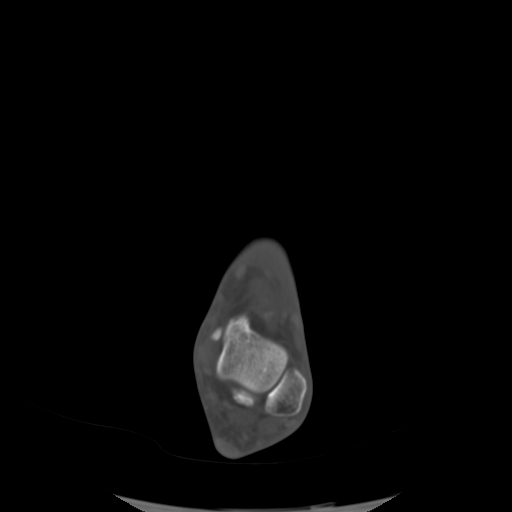

[Series 608: <mpr thick range(6)> · sagittal · 0.59mm/px · 5 of 58 slices shown]
[im 10/58  bone]
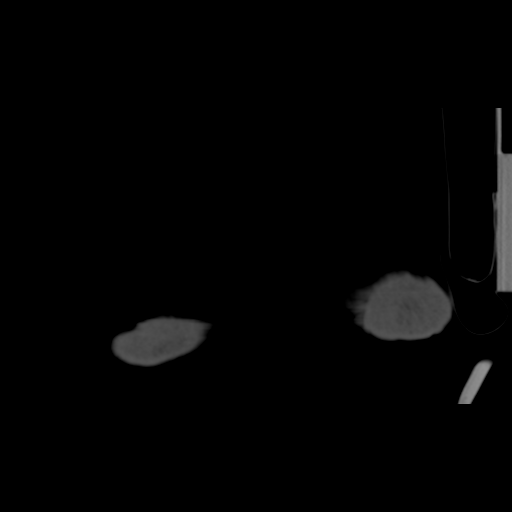
[im 20/58  bone]
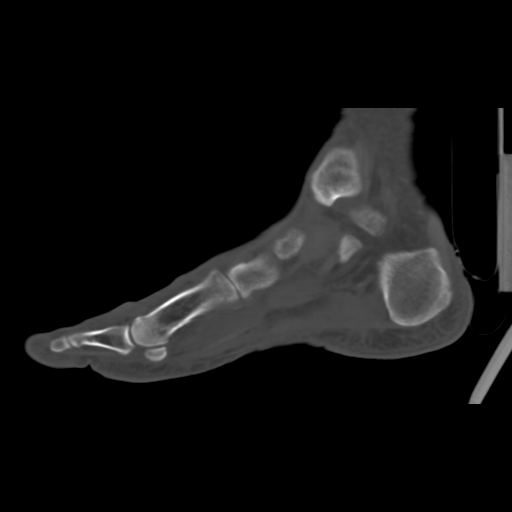
[im 29/58  bone]
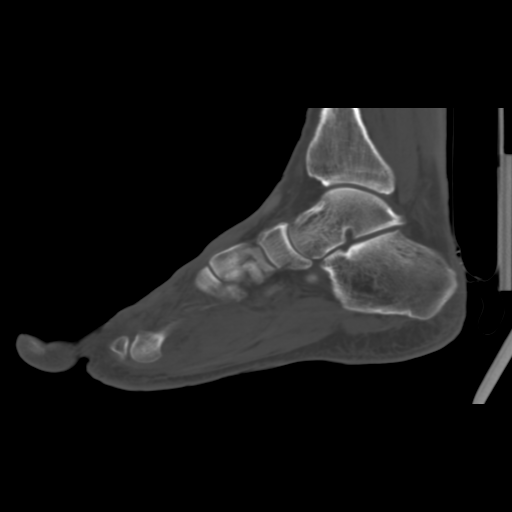
[im 39/58  bone]
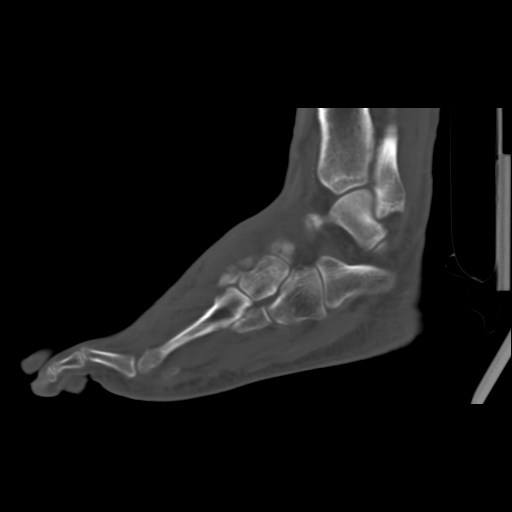
[im 48/58  bone]
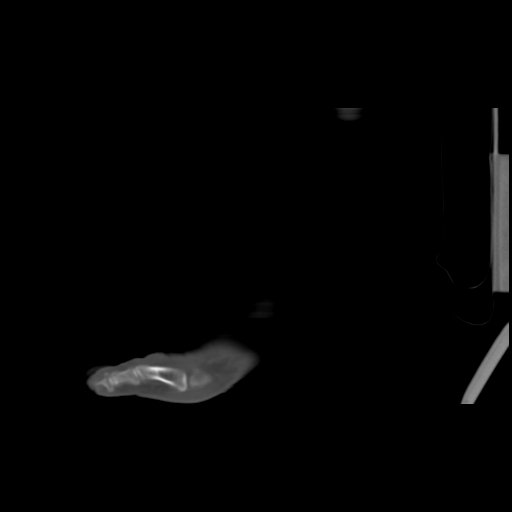

[9 of 33 positions shown; findings below may reference images not displayed]

FINDINGS: There is a mildly displaced fracture through the midportion of the
second metatarsal. There also appears to be a minimally displaced
fracture through the proximal aspect of the third metatarsal. The
second metatarsal fracture is mildly comminuted, with extension into
the tarsometatarsal articulation. There also appears to be extension
of the third metatarsal fracture into the tarsometatarsal
articulation.

In addition, a small dorsal osseous fragment is noted arising at the
base of the second metatarsal, and a small dorsal osseous fragment
is noted at the middle cuneiform. No definite Lisfranc injury is
characterized, as the bases of the second and third metatarsals
appear grossly normally aligned, though fractures do extend through
the bases of the second and third metatarsals as described above.
There is question of a tiny osseous fragment arising from the
plantar aspect of the medial cuneiform. This may reflect remote
injury.

Dorsal soft tissue swelling is noted about the midfoot. Visualized
flexor and extensor tendons are grossly unremarkable, though the
extensor tendons are difficult to fully assess. Mild vascular
calcification is noted. The Achilles tendon appears intact.
IMPRESSION: 1. Mildly displaced fracture through the midportion of the second
metatarsal. Minimally displaced fracture through the proximal aspect
of the third metatarsal. The second metatarsal fracture is mildly
comminuted, with extension proximal into the tarsometatarsal
articulation. Apparent extension of the third metatarsal fracture
into the tarsometatarsal articulation.
2. Small avulsion fractures at the dorsum of the base of the second
metatarsal, and at the dorsum of the middle cuneiform. No definite
Lisfranc injury characterized.
3. Question of tiny osseous fragment arising from the plantar aspect
of the medial cuneiform. This may reflect remote injury.
4. Dorsal soft tissue swelling about the midfoot.

## 2017-02-20 DIAGNOSIS — C61 Malignant neoplasm of prostate: Secondary | ICD-10-CM | POA: Diagnosis not present

## 2017-02-27 DIAGNOSIS — C61 Malignant neoplasm of prostate: Secondary | ICD-10-CM | POA: Diagnosis not present

## 2017-02-27 DIAGNOSIS — N5201 Erectile dysfunction due to arterial insufficiency: Secondary | ICD-10-CM | POA: Diagnosis not present

## 2017-03-06 DIAGNOSIS — R69 Illness, unspecified: Secondary | ICD-10-CM | POA: Diagnosis not present

## 2017-09-04 DIAGNOSIS — R69 Illness, unspecified: Secondary | ICD-10-CM | POA: Diagnosis not present
